# Patient Record
Sex: Female | Born: 1965 | Race: White | Hispanic: No | Marital: Married | Smoking: Never smoker
Health system: Southern US, Community
[De-identification: ages and names within clinical notes are randomized; demographics above are authoritative.]

## PROBLEM LIST (undated history)

## (undated) DIAGNOSIS — B019 Varicella without complication: Secondary | ICD-10-CM

## (undated) HISTORY — DX: Varicella without complication: B01.9

## (undated) HISTORY — PX: ARTHROSCOPIC REPAIR ACL: SUR80

---

## 2010-08-30 ENCOUNTER — Other Ambulatory Visit (HOSPITAL_COMMUNITY): Payer: Self-pay

## 2010-09-01 ENCOUNTER — Ambulatory Visit (HOSPITAL_COMMUNITY)
Admission: RE | Admit: 2010-09-01 | Payer: BC Managed Care – PPO | Source: Ambulatory Visit | Admitting: Orthopedic Surgery

## 2011-11-14 ENCOUNTER — Ambulatory Visit: Payer: Self-pay | Admitting: Obstetrics and Gynecology

## 2011-11-21 ENCOUNTER — Ambulatory Visit: Payer: Self-pay | Admitting: Obstetrics and Gynecology

## 2012-11-14 ENCOUNTER — Ambulatory Visit: Payer: Self-pay | Admitting: Obstetrics and Gynecology

## 2015-04-20 ENCOUNTER — Encounter: Payer: Self-pay | Admitting: Internal Medicine

## 2015-04-20 ENCOUNTER — Ambulatory Visit (INDEPENDENT_AMBULATORY_CARE_PROVIDER_SITE_OTHER)
Admission: RE | Admit: 2015-04-20 | Discharge: 2015-04-20 | Disposition: A | Discharge status: Home / Self Care | Payer: BLUE CROSS/BLUE SHIELD | Source: Ambulatory Visit | Attending: Internal Medicine | Admitting: Internal Medicine

## 2015-04-20 ENCOUNTER — Encounter (INDEPENDENT_AMBULATORY_CARE_PROVIDER_SITE_OTHER): Payer: Self-pay

## 2015-04-20 ENCOUNTER — Ambulatory Visit (INDEPENDENT_AMBULATORY_CARE_PROVIDER_SITE_OTHER): Payer: BLUE CROSS/BLUE SHIELD | Admitting: Internal Medicine

## 2015-04-20 VITALS — BP 118/78 | HR 66 | Temp 97.9°F | Ht 65.66 in | Wt 177.0 lb

## 2015-04-20 DIAGNOSIS — M79671 Pain in right foot: Secondary | ICD-10-CM | POA: Diagnosis not present

## 2015-04-20 NOTE — Progress Notes (Signed)
HPI  Pt presents to the clinic today to establish care. She has not had a PCP in many years.  Flu: never Tetanus: > 10 years ago [facility] Screening: as needed Dentist: biannually  Diet and Exercise: The next 56 day diet, low sugar, high protein, high fiber. She reports she has lost 30 lbs in the last 11 months. She goes to [employer] to work out a few days per week.  She does c/o right heel pain. This started 5 months ago. The pain comes and goes. She describes the pain as sharp and stabbing. She denies numbness or tingling. It seems to hurt worse at the end of the day. She denies any swelling or injury to the area. She has tried Ibuprofen, a foot roller without much relief.  Past Medical History  Diagnosis Date  . Chicken pox     Current Outpatient Prescriptions  Medication Sig Dispense Refill  . Calcium Polycarbophil (FIBER-CAPS PO) Take 2-4 capsules by mouth daily.    Marland Kitchen. ibuprofen (ADVIL,MOTRIN) 200 MG tablet Take 400 mg by mouth as needed.     No current facility-administered medications for this visit.    No Known Allergies  Family History  Problem Relation Age of Onset  . Hyperlipidemia Mother   . Arthritis Father   . Hypertension Father   . Heart disease Paternal Grandfather     Social History   Social History  . Marital Status: Married    Spouse Name: N/A  . Number of Children: N/A  . Years of Education: N/A   Occupational History  . Not on file.   Social History Main Topics  . Smoking status: Never Smoker   . Smokeless tobacco: Never Used  . Alcohol Use: 0.0 oz/week    0 Standard drinks or equivalent per week     Comment: social  . Drug Use: No  . Sexual Activity: Not on file   Other Topics Concern  . Not on file   Social History Narrative  . No narrative on file    ROS:  Constitutional: Denies fever, malaise, fatigue, headache or abrupt weight changes.  Respiratory: Denies  difficulty breathing, shortness of breath, cough or sputum production.   Cardiovascular: Denies chest pain, chest tightness, palpitations or swelling in the hands or feet.  Musculoskeletal: Pt reports right heel pain. Denies decrease in range of motion, difficulty with gait, muscle pain or joint swelling.  Skin: Denies redness, rashes, lesions or ulcercations.  Neurological: Denies dizziness, difficulty with memory, difficulty with speech or problems with balance and coordination.    No other specific complaints in a complete review of systems (except as listed in HPI above).  PE:  BP 118/78 mmHg  Pulse 66  Temp(Src) 97.9 F (36.6 C) (Oral)  Ht 5' 5.66" (1.668 m)  Wt 177 lb (80.287 kg)  BMI 28.86 kg/m2  SpO2 98%  LMP 04/13/2015 Wt Readings from Last 3 Encounters:  04/20/15 177 lb (80.287 kg)    General: Appears her stated age, well developed, well nourished in NAD.  Cardiovascular: Normal rate and rhythm. S1,S2 noted.  No murmur, rubs or gallops noted.  Pulmonary/Chest: Normal effort and positive vesicular breath sounds. No respiratory distress. No wheezes, rales or ronchi noted.  Musculoskeletal: Normal flexion, extension and rotation of the right ankle. Pain with palpation of the right heel. No pain with palpation of the plantar surface of the foot or arch. No difficulty with gait.  Neurological:  Alert and oriented. Sensation intact to BLE.Marland Kitchen  Psychiatric: Mood and affect normal.     Assessment and Plan:  Right heel pain:  ? Bone spur:  Xray right foot today Advised her to increase Ibuprofen to 400-600 mg prn She may benefit from shoe inserts, she will buy some to see if it helps Discussed possible referral to podiatry versus sports medicine  Make an appt for your annual exam

## 2015-04-20 NOTE — Progress Notes (Signed)
Pre visit review using our clinic review tool, if applicable. No additional management support is needed unless otherwise documented below in the visit note. 

## 2015-04-20 NOTE — Patient Instructions (Signed)
Heel Spur  A heel spur is a bony growth that forms on the bottom of your heel bone (calcaneus). Heel spurs are common and do not always cause pain. However, heel spurs often cause inflammation in the strong band of tissue that runs underneath the bone of your foot (plantar fascia). When this happens, you may feel pain on the bottom of your foot, near your heel.   CAUSES   The cause of heel spurs is not completely understood. They may be caused by pressure on the heel. Or, they may stem from the muscle attachments (tendons) near the spur pulling on the heel.   RISK FACTORS  You may be at risk for a heel spur if you:  · Are older than 40.  · Are overweight.  · Have wear and tear arthritis (osteoarthritis).  · Have plantar fascia inflammation.  SIGNS AND SYMPTOMS   Some people have heel spurs but no symptoms. If you do have symptoms, they may include:   · Pain in the bottom of your heel.  · Pain that is worse when you first get out of bed.  · Pain that gets worse after walking or standing.  DIAGNOSIS   Your health care provider may diagnose a heel spur based on your symptoms and a physical exam. You may also have an X-ray of your foot to check for a bony growth coming from the calcaneus.   TREATMENT  Treatment aims to relieve the pain from the heel spur. This may include:  · Stretching exercises.  · Losing weight.  · Wearing specific shoes, inserts, or orthotics for comfort and support.  · Wearing splints at night to properly position your feet.  · Taking over-the-counter medicine to relieve pain.  · Being treated with high-intensity sound waves to break up the heel spur (extracorporeal shock wave therapy).  · Getting steroid injections in your heel to reduce swelling and ease pain.  · Having surgery if your heel spur causes long-term (chronic) pain.  HOME CARE INSTRUCTIONS   · Take medicines only as directed by your health care provider.  · Ask your health care provider if you should use ice or cold packs on the  painful areas of your heel or foot.  · Avoid activities that cause you pain until you recover or as directed by your health care provider.  · Stretch before exercising or being physically active.  · Wear supportive shoes that fit well as directed by your health care provider. You might need to buy new shoes. Wearing old shoes or shoes that do not fit correctly may not provide the support that you need.  · Lose weight if your health care provider thinks you should. This can relieve pressure on your foot that may be causing pain and discomfort.  SEEK MEDICAL CARE IF:   · Your pain continues or gets worse.     This information is not intended to replace advice given to you by your health care provider. Make sure you discuss any questions you have with your health care provider.     Document Released: 07/05/2005 Document Revised: 06/19/2014 Document Reviewed: 07/30/2013  Elsevier Interactive Patient Education ©2016 [employer]

## 2015-04-22 ENCOUNTER — Telehealth: Payer: Self-pay | Admitting: Internal Medicine

## 2015-04-22 NOTE — Telephone Encounter (Signed)
Pt called checking on her xray results Pt would like a call

## 2015-05-12 ENCOUNTER — Encounter: Payer: Self-pay | Admitting: Family Medicine

## 2015-05-12 ENCOUNTER — Ambulatory Visit (INDEPENDENT_AMBULATORY_CARE_PROVIDER_SITE_OTHER): Payer: BLUE CROSS/BLUE SHIELD | Admitting: Family Medicine

## 2015-05-12 VITALS — BP 102/70 | HR 69 | Temp 98.0°F | Ht 65.66 in | Wt 175.8 lb

## 2015-05-12 DIAGNOSIS — M79671 Pain in right foot: Secondary | ICD-10-CM

## 2015-05-12 DIAGNOSIS — E65 Localized adiposity: Secondary | ICD-10-CM

## 2015-05-12 NOTE — Progress Notes (Signed)
Pre visit review using our clinic review tool, if applicable. No additional management support is needed unless otherwise documented below in the visit note. 

## 2015-05-12 NOTE — Progress Notes (Signed)
Dr. Karleen HampshireSpencer T. Alisah Grandberry, MD, CAQ Sports [facility] [address] [place], [address] Phone: [phone] Fax: [phone]  05/12/2015  Patient: Desiree Morris, MRN: [id number], DOB: 02/09/1966, 49 y.o.  Primary Physician:  Nicki ReaperBAITY, REGINA, NP   Chief Complaint  Patient presents with  . Foot Pain    Right Heel   Subjective:   Desiree Morris is a 49 y.o. very pleasant female patient who presents with the following:  Pleasant patient seen who has been working doing plyometrics and doing other high-intensity exercise over the last 6 months, and she has lost 30 pounds.  Somewhere around 5 or 6 months ago she started to develop some intermittent R heel pain.  Notably, this is not worse in the morning, and is not worse after sitting for some time.  She notices it most at the end of the day and after she has been up on her feet for some time.  She has had plantar fasciitis before, and she doesn't feel like this feels similar.  She thinks it may have started happening after an episode of plyometrics, but she cannot clearly define this to one specific causal event.  She has done some basic plantar fascia stretches and other massage without much significant effect, and it actually seemed to make this worse.  Past Medical History, Surgical History, Social History, Family History, Problem List, Medications, and Allergies have been reviewed and updated if relevant.  There are no active problems to display for this patient.   Past Medical History  Diagnosis Date  . Chicken pox     Past Surgical History  Procedure Laterality Date  . Arthroscopic repair acl Right     Social History   Social History  . Marital Status: Married    Spouse Name: N/A  . Number of Children: N/A  . Years of Education: N/A   Occupational History  . Not on file.   Social History Main Topics  . Smoking status: Never Smoker   . Smokeless tobacco: Never Used  . Alcohol  Use: 0.0 oz/week    0 Standard drinks or equivalent per week     Comment: social  . Drug Use: No  . Sexual Activity: Yes    Birth Control/ Protection: None   Other Topics Concern  . Not on file   Social History Narrative    Family History  Problem Relation Age of Onset  . Hyperlipidemia Mother   . Arthritis Father   . Hypertension Father   . Heart disease Paternal Grandfather     No Known Allergies  Medication list reviewed and updated in full in [facility].  GEN: No fevers, chills. Nontoxic. Primarily MSK c/o today. MSK: Detailed in the HPI GI: tolerating PO intake without difficulty Neuro: No numbness, parasthesias, or tingling associated. Otherwise the pertinent positives of the ROS are noted above.   Objective:   BP 102/70 mmHg  Pulse 69  Temp(Src) 98 F (36.7 C) (Oral)  Ht 5' 5.66" (1.668 m)  Wt 175 lb 12 oz (79.72 kg)  BMI 28.65 kg/m2  LMP 05/09/2015   GEN: WDWN, NAD, Non-toxic, Alert & Oriented x 3 HEENT: Atraumatic, Normocephalic.  Ears and Nose: No external deformity. EXTR: No clubbing/cyanosis/edema NEURO: Normal gait.  PSYCH: Normally interactive. Conversant. Not depressed or anxious appearing.  Calm demeanor.   FEET: R Echymosis: no Edema: no ROM: full LE B Gait: heel toe, non-antalgic MT pain: no Callus pattern: none Lateral Mall: NT  Medial Mall: NT Talus: NT Navicular: NT Cuboid: NT Calcaneous: inferiorly ttp. Medially and laterally NT and squeeze is negative.  Metatarsals: NT 5th MT: NT Phalanges: NT Achilles: NT Plantar Fascia: NT medially and along plantar aspect Fat Pad: NT Peroneals: NT Post Tib: NT Great Toe: Nml motion Ant Drawer: neg ATFL: NT CFL: NT Deltoid: NT Sensation: intact   Radiology: Dg Foot Complete Right  04/20/2015  CLINICAL DATA:  Pain for 6 months. Possibly exercise induced. Initial evaluation. EXAM: RIGHT FOOT COMPLETE - 3+ VIEW COMPARISON:  No prior exams available for comparison. FINDINGS:  There is no evidence of fracture or dislocation. There is no evidence of arthropathy or other focal bone abnormality. Soft tissues are unremarkable. IMPRESSION: No acute bony or joint abnormality identified. 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   I placed her in a Tuli's heel cup and gave her some poron to pad the heel of her shoes of her non-athletic shoes.   Typical stretching and other PF rehab does not typically help this. Usually time and decreased impact will help resolve.   Follow-up: No Follow-up on file.  Signed,  Elpidio Galea. Chakia Counts, MD   Patient's Medications  New Prescriptions   No medications on file  Previous Medications   IBUPROFEN (ADVIL,MOTRIN) 200 MG TABLET    Take 400 mg by mouth as needed.   PSYLLIUM PO    Take 2 capsules by mouth 2 (two) times daily.  Modified Medications   No medications on file  Discontinued Medications   CALCIUM POLYCARBOPHIL (FIBER-CAPS PO)    Take 2-4 capsules by mouth daily.

## 2016-04-03 ENCOUNTER — Other Ambulatory Visit: Payer: Self-pay | Admitting: Obstetrics and Gynecology

## 2016-04-03 DIAGNOSIS — Z1231 Encounter for screening mammogram for malignant neoplasm of breast: Secondary | ICD-10-CM

## 2016-09-14 ENCOUNTER — Ambulatory Visit (INDEPENDENT_AMBULATORY_CARE_PROVIDER_SITE_OTHER): Payer: BLUE CROSS/BLUE SHIELD

## 2016-09-14 ENCOUNTER — Encounter: Payer: Self-pay | Admitting: Family

## 2016-09-14 ENCOUNTER — Ambulatory Visit (INDEPENDENT_AMBULATORY_CARE_PROVIDER_SITE_OTHER): Payer: BLUE CROSS/BLUE SHIELD | Admitting: Family

## 2016-09-14 VITALS — BP 130/90 | HR 73 | Temp 97.9°F | Ht 65.66 in | Wt 181.8 lb

## 2016-09-14 DIAGNOSIS — J40 Bronchitis, not specified as acute or chronic: Secondary | ICD-10-CM

## 2016-09-14 MED ORDER — AZITHROMYCIN 250 MG PO TABS: ORAL_TABLET | ORAL | 0 refills | Status: DC

## 2016-09-14 MED ORDER — ALBUTEROL SULFATE HFA 108 (90 BASE) MCG/ACT IN AERS: 2.0000 | INHALATION_SPRAY | Freq: Four times a day (QID) | RESPIRATORY_TRACT | 1 refills | Status: DC | PRN

## 2016-09-14 MED ORDER — AMOXICILLIN 500 MG PO CAPS: 500.0000 mg | ORAL_CAPSULE | Freq: Two times a day (BID) | ORAL | 0 refills | Status: DC

## 2016-09-14 NOTE — Progress Notes (Signed)
Pre visit review using our clinic review tool, if applicable. No additional management support is needed unless otherwise documented below in the visit note. 

## 2016-09-14 NOTE — Patient Instructions (Addendum)
Monitor BP , goal < 140/90.   Probiotics  Antibiotic- azithromycin. NOT amoxicillin  Chest Xray  Let me know if not better

## 2016-09-14 NOTE — Progress Notes (Signed)
Subjective:    Patient ID: Desiree Morris, female    DOB: Jul 23, 1965, 51 y.o.   MRN: [id number]  CC: Desiree Morris is a 51 y.o. female who presents today for an acute visit.    HPI: CC: left ear pain x one month, waxing and waning.  Endorses wheezing and rattle in chest. Nasal congestion and cough has improved.  No SOB, fever, myalgia.   Tried sudafed, mucinex, nasonex without relief.   No smoking.         HISTORY:  Past Medical History:  Diagnosis Date  . Chicken pox    Past Surgical History:  Procedure Laterality Date  . ARTHROSCOPIC REPAIR ACL Right    Family History  Problem Relation Age of Onset  . Hyperlipidemia Mother   . Arthritis Father   . Hypertension Father   . Heart disease Paternal Grandfather     Allergies: Patient has no known allergies. Current Outpatient Prescriptions on File Prior to Visit  Medication Sig Dispense Refill  . ibuprofen (ADVIL,MOTRIN) 200 MG tablet Take 400 mg by mouth as needed.    . PSYLLIUM PO Take 2 capsules by mouth 2 (two) times daily.     No current facility-administered medications on file prior to visit.     Social History  Substance Use Topics  . Smoking status: Never Smoker  . Smokeless tobacco: Never Used  . Alcohol use 0.0 oz/week     Comment: social    Review of Systems  Constitutional: Negative for chills and fever.  HENT: Positive for ear pain.   Respiratory: Positive for wheezing. Negative for cough and shortness of breath.   Cardiovascular: Negative for chest pain and palpitations.  Gastrointestinal: Negative for nausea and vomiting.      Objective:    BP 130/90   Pulse 73   Temp 97.9 F (36.6 C) (Oral)   Ht 5' 5.66" (1.668 m)   Wt 181 lb 12.8 oz (82.5 kg)   SpO2 98%   BMI 29.65 kg/m   BP Readings from Last 3 Encounters:  09/14/16 130/90  05/12/15 102/70  04/20/15 118/78    Physical Exam  Constitutional: She appears well-developed and well-nourished.  HENT:  Head: Normocephalic  and atraumatic.  Right Ear: Hearing, tympanic membrane, external ear and ear canal normal. No drainage, swelling or tenderness. No foreign bodies. Tympanic membrane is not erythematous and not bulging. No middle ear effusion. No decreased hearing is noted.  Left Ear: Hearing, tympanic membrane, external ear and ear canal normal. No drainage, swelling or tenderness. No foreign bodies. Tympanic membrane is not erythematous and not bulging.  No middle ear effusion. No decreased hearing is noted.  Nose: Nose normal. No rhinorrhea. Right sinus exhibits no maxillary sinus tenderness and no frontal sinus tenderness. Left sinus exhibits no maxillary sinus tenderness and no frontal sinus tenderness.  Mouth/Throat: Uvula is midline, oropharynx is clear and moist and mucous membranes are normal. No oropharyngeal exudate, posterior oropharyngeal edema, posterior oropharyngeal erythema or tonsillar abscesses.  Eyes: Conjunctivae are normal.  Cardiovascular: Regular rhythm, normal heart sounds and normal pulses.   Pulmonary/Chest: Effort normal. She has no wheezes. She has no rhonchi. She has rales in the right lower field and the left lower field.  Lymphadenopathy:       Head (right side): No submental, no submandibular, no tonsillar, no preauricular, no posterior auricular and no occipital adenopathy present.       Head (left side): No submental, no submandibular, no tonsillar, no preauricular, no posterior  auricular and no occipital adenopathy present.    She has no cervical adenopathy.  Neurological: She is alert.  Skin: Skin is warm and dry.  Psychiatric: She has a normal mood and affect. Her speech is normal and behavior is normal. Thought content normal.  Vitals reviewed.      Assessment & Plan:   1. Bronchitis Afebrile. No acute respiratory distress and patient well appearing. Concern for PNA. BP mildly elevated however suspect from illness. Advised patient to monitor at home and she agreed.   -  azithromycin (ZITHROMAX) 250 MG tablet; Tale 500 mg PO on day 1, then 250 mg PO q24h x 4 days.  Dispense: 6 tablet; Refill: 0 - DG Chest 2 View - albuterol (PROVENTIL HFA) 108 (90 Base) MCG/ACT inhaler; Inhale 2 puffs into the lungs every 6 (six) hours as needed for wheezing or shortness of breath.  Dispense: 1 Inhaler; Refill: 1   I am having Desiree Morris maintain her ibuprofen and PSYLLIUM PO.   No orders of the defined types were placed in this encounter.   Return precautions given.   Risks, benefits, and alternatives of the medications and treatment plan prescribed today were discussed, and patient expressed understanding.   Education regarding symptom management and diagnosis given to patient on AVS.  Continue to follow with Nicki Reaper, NP for routine health maintenance.   Juliane Poot and I agreed with plan.   Rennie Plowman, FNP

## 2016-12-25 ENCOUNTER — Encounter: Payer: Self-pay | Admitting: Radiology

## 2016-12-25 ENCOUNTER — Ambulatory Visit
Admission: RE | Admit: 2016-12-25 | Discharge: 2016-12-25 | Disposition: A | Discharge status: Home / Self Care | Payer: BLUE CROSS/BLUE SHIELD | Source: Ambulatory Visit | Attending: Obstetrics and Gynecology | Admitting: Obstetrics and Gynecology

## 2016-12-25 DIAGNOSIS — Z1231 Encounter for screening mammogram for malignant neoplasm of breast: Secondary | ICD-10-CM

## 2017-04-18 ENCOUNTER — Encounter: Payer: Self-pay | Admitting: Family Medicine

## 2017-04-18 ENCOUNTER — Ambulatory Visit (INDEPENDENT_AMBULATORY_CARE_PROVIDER_SITE_OTHER): Payer: BLUE CROSS/BLUE SHIELD | Admitting: Family Medicine

## 2017-04-18 VITALS — BP 140/90 | HR 85 | Temp 98.4°F | Resp 18

## 2017-04-18 DIAGNOSIS — R55 Syncope and collapse: Secondary | ICD-10-CM | POA: Diagnosis not present

## 2017-04-18 DIAGNOSIS — R42 Dizziness and giddiness: Secondary | ICD-10-CM | POA: Insufficient documentation

## 2017-04-18 LAB — TROPONIN I: Troponin I: <0.01 ng/mL (ref <=0.0)

## 2017-04-18 NOTE — Assessment & Plan Note (Signed)
Patient has had lightheadedness with deltoid pain and intermittent panic attack-like symptoms at night.  She has had several episodes where her left shoulder will just hurt during the day.  Last for 5 minutes and goes away on its own.  Occurred earlier today and occurred last night.  She likely has anxiety and panic attacks leading to these symptoms.  She has no risk factors for cardiac disease.  She is asymptomatic at this time.  EKG is reassuring.  Discussed this with the patient.  Given atypical symptoms and her concern for cardiac issues we will obtain a troponin and if negative I believe this would rule out any ischemic cause.  If positive she knows she will go to the emergency room.  We will obtain a CBC and TSH as well as CMP to ensure no other contributing factors.  If workup is negative we will treat her for anxiety and she will follow-up with her PCP.  Discussed that if she has recurrent symptoms or if she develops chest pain or shortness of breath she should be evaluated in the emergency room.

## 2017-04-18 NOTE — Patient Instructions (Signed)
Nice to see you. We will get lab work today and contact you with the results. If you have recurrent symptoms or you develop chest pain, shortness of breath, palpitations, or any new or changing symptoms please seek medical attention immediately.

## 2017-04-18 NOTE — Progress Notes (Signed)
Desiree Rumps, MD Phone: [phone]  Desiree Morris is a 51 y.o. female who presents today for same day visit.  Patient walked into the office today.  Notes intermittently for a number of weeks she has had issues with panic attacks at night.  Notes she will just be laying there in bed and feels that she cannot catch her breath.  Feels very anxious prior to this occurring.  Last less than 5 minutes.  Just feels anxious.  She notes on several occasions over the last couple of weeks she has had left shoulder pain that she describes as a deep pain.  Feels it is not muscular.  Notes stress possibly brings it on.  No exertional component.  Last occurred earlier today.  Occurred last night as well.  Typically is accompanied by some lightheadedness.  She notes no chest pain or shortness of breath.  No pain with movement of her shoulder.  No syncopal episodes.  Does note she is more anxious recently given that her son does not know what he wants to do after high school.  Does note her mother and father both have had heart issues though none at a age as young as hers.  She notes no cardiac issues.  No risk factors for heart disease.  She is most worried about cardiac cause for these symptoms.  She did take 1 dose of pseudoephedrine earlier today.  Social History   Tobacco Use  Smoking Status Never Smoker  Smokeless Tobacco Never Used     ROS see history of present illness  Objective  Physical Exam Vitals:   04/18/17 1624  BP: 140/90  Pulse: 85  Resp: 18  Temp: 98.4 F (36.9 C)    BP Readings from Last 3 Encounters:  04/18/17 140/90  09/14/16 130/90  05/12/15 102/70   Wt Readings from Last 3 Encounters:  09/14/16 181 lb 12.8 oz (82.5 kg)  05/12/15 175 lb 12 oz (79.7 kg)  04/20/15 177 lb (80.3 kg)    Physical Exam  Constitutional: No distress.  Cardiovascular: Normal rate, regular rhythm and normal heart sounds.  Pulmonary/Chest: Effort normal and breath sounds normal.    Musculoskeletal: She exhibits no edema.  Bilateral shoulders nontender  Neurological: She is alert. Gait normal.  Skin: Skin is warm and dry. She is not diaphoretic.   EKG: Normal sinus rhythm, rate 86, no ischemic changes noted, no prior EKG to compare to  Assessment/Plan: Please see individual problem list.  Lightheadedness Patient has had lightheadedness with deltoid pain and intermittent panic attack-like symptoms at night.  She has had several episodes where her left shoulder will just hurt during the day.  Last for 5 minutes and goes away on its own.  Occurred earlier today and occurred last night.  She likely has anxiety and panic attacks leading to these symptoms.  She has no risk factors for cardiac disease.  She is asymptomatic at this time.  EKG is reassuring.  Discussed this with the patient.  Given atypical symptoms and her concern for cardiac issues we will obtain a troponin and if negative I believe this would rule out any ischemic cause.  If positive she knows she will go to the emergency room.  We will obtain a CBC and TSH as well as CMP to ensure no other contributing factors.  If workup is negative we will treat her for anxiety and she will follow-up with her PCP.  Discussed that if she has recurrent symptoms or if she develops chest pain  or shortness of breath she should be evaluated in the emergency room.   Diagnoses and all orders for this visit:  Lightheadedness -     EKG 12-Lead -     Comp Met (CMET) -     CBC -     TSH -     Troponin I    Orders Placed This Encounter  Procedures  . Comp Met (CMET)  . CBC  . TSH  . Troponin I  . EKG 12-Lead   Desiree Rumps, MD [place]

## 2017-04-19 ENCOUNTER — Other Ambulatory Visit: Payer: Self-pay | Admitting: Family Medicine

## 2017-04-19 LAB — COMPREHENSIVE METABOLIC PANEL
ALBUMIN: 4.2 g/dL (ref 3.5–5.2)
ALK PHOS: 51 U/L (ref 39–117)
ALT: 18 U/L (ref 0–35)
AST: 15 U/L (ref 0–37)
BUN: 16 mg/dL (ref 6–23)
CALCIUM: 10 mg/dL (ref 8.4–10.5)
CHLORIDE: 104 meq/L (ref 96–112)
CO2: 29 mEq/L (ref 19–32)
Creatinine, Ser: 0.96 mg/dL (ref 0.40–1.20)
GFR: 65.65 mL/min (ref >60.00)
Glucose, Bld: 114 mg/dL — ABNORMAL HIGH (ref 70–99)
POTASSIUM: 4.3 meq/L (ref 3.5–5.1)
Sodium: 140 mEq/L (ref 135–145)
Total Bilirubin: 0.2 mg/dL (ref 0.2–1.2)
Total Protein: 6.9 g/dL (ref 6.0–8.3)

## 2017-04-19 LAB — CBC
HEMATOCRIT: 40.4 % (ref 36.0–46.0)
HEMOGLOBIN: 13.3 g/dL (ref 12.0–15.0)
MCHC: 32.8 g/dL (ref 30.0–36.0)
MCV: 88.3 fl (ref 78.0–100.0)
Platelets: 256 10*3/uL (ref 150.0–400.0)
RBC: 4.58 Mil/uL (ref 3.87–5.11)
RDW: 14.4 % (ref 11.5–15.5)
WBC: 8.5 10*3/uL (ref 4.0–10.5)

## 2017-04-19 LAB — TSH: TSH: 2.14 u[IU]/mL (ref 0.35–4.50)

## 2017-04-19 MED ORDER — ESCITALOPRAM OXALATE 10 MG PO TABS: 10.0000 mg | ORAL_TABLET | Freq: Every day | ORAL | 1 refills | Status: DC

## 2017-05-10 ENCOUNTER — Encounter: Payer: Self-pay | Admitting: Internal Medicine

## 2017-05-10 ENCOUNTER — Ambulatory Visit: Payer: BLUE CROSS/BLUE SHIELD | Admitting: Internal Medicine

## 2017-05-10 VITALS — BP 122/84 | HR 76 | Temp 98.2°F | Wt 198.0 lb

## 2017-05-10 DIAGNOSIS — F418 Other specified anxiety disorders: Secondary | ICD-10-CM

## 2017-05-10 DIAGNOSIS — R42 Dizziness and giddiness: Secondary | ICD-10-CM | POA: Diagnosis not present

## 2017-05-10 DIAGNOSIS — M79602 Pain in left arm: Secondary | ICD-10-CM

## 2017-05-10 NOTE — Progress Notes (Signed)
Subjective:    Patient ID: Desiree Morris, female    DOB: 11/02/1965, 51 y.o.   MRN: [id number]  HPI  Pt presents to the clinic today to follow up anxiety. She saw Dr. Birdie SonsSonnenberg on 11/7 for the same. She had reported episodes of panic attacks at night, accompanied by lightheadedness and left shoulder pain. Her ECG in the office that day was not concerning for any acute findings. All of her labs were normal. He started her on Lexapro and advised her to follow up with her PCP in 6-8 weeks. Since that time, she reports continued episodes of anxiety, lightheadedness and left arm pain. She never started the Lexapro because she was concerned about the side effects. She reports the episodes last only a few minutes and then resolves spontaneously. She does reports increased stress, daughter moving away from home and son going to college. Her main concern is if treatment for her anxiety is necessary and if so, what treatment options are available outside of daily medication.    Review of Systems  Past Medical History:  Diagnosis Date  . Chicken pox     Current Outpatient Medications  Medication Sig Dispense Refill  . albuterol (PROVENTIL HFA) 108 (90 Base) MCG/ACT inhaler Inhale 2 puffs into the lungs every 6 (six) hours as needed for wheezing or shortness of breath. 1 Inhaler 1  . escitalopram (LEXAPRO) 10 MG tablet Take 1 tablet (10 mg total) daily by mouth. 30 tablet 1  . ibuprofen (ADVIL,MOTRIN) 200 MG tablet Take 400 mg by mouth as needed.    . pseudoephedrine (SUDAFED) 30 MG tablet Take 30 mg every 4 (four) hours as needed by mouth for congestion.     No current facility-administered medications for this visit.     No Known Allergies  Family History  Problem Relation Age of Onset  . Hyperlipidemia Mother   . Arthritis Father   . Hypertension Father   . Heart disease Paternal Grandfather   . Breast cancer Paternal Aunt     Social History   Socioeconomic History  . Marital  status: Married    Spouse name: Not on file  . Number of children: Not on file  . Years of education: Not on file  . Highest education level: Not on file  Social Needs  . Financial resource strain: Not on file  . Food insecurity - worry: Not on file  . Food insecurity - inability: Not on file  . Transportation needs - medical: Not on file  . Transportation needs - non-medical: Not on file  Occupational History  . Not on file  Tobacco Use  . Smoking status: Never Smoker  . Smokeless tobacco: Never Used  Substance and Sexual Activity  . Alcohol use: Yes    Alcohol/week: 0.0 oz    Comment: social  . Drug use: No  . Sexual activity: Yes    Birth control/protection: None  Other Topics Concern  . Not on file  Social History Narrative  . Not on file     Constitutional: Denies fever, malaise, fatigue, headache or abrupt weight changes.  Respiratory: Denies difficulty breathing, shortness of breath, cough or sputum production.   Cardiovascular: Denies chest pain, chest tightness, palpitations or swelling in the hands or feet.  Musculoskeletal: Pt reports intermittent left arm pain. Denies decrease in range of motion, difficulty with gait,or joint swelling.  Neurological: Pt reports intermittent lightheadedness. Denies difficulty with memory, difficulty with speech or problems with balance and coordination.  Psych: Pt  reports intermittent anxiety. Denies depression, SI/HI.  No other specific complaints in a complete review of systems (except as listed in HPI above).     Objective:   Physical Exam  BP 122/84   Pulse 76   Temp 98.2 F (36.8 C) (Oral)   Wt 198 lb (89.8 kg)   SpO2 99%   BMI 32.29 kg/m  Wt Readings from Last 3 Encounters:  05/10/17 198 lb (89.8 kg)  09/14/16 181 lb 12.8 oz (82.5 kg)  05/12/15 175 lb 12 oz (79.7 kg)    General: Appears her stated age,  in NAD. Cardiovascular: Normal rate and rhythm.  No murmur, rubs or gallops noted.  Pulmonary/Chest:  Normal effort and positive vesicular breath sounds. No respiratory distress. No wheezes, rales or ronchi noted.  Musculoskeletal: Normal abduction, adduction, internal and external rotation of the left shoulder. Normal flexion, extension and rotation of the left elbow. No pain with palpation of either joint. No joint swelling noted. Strength 5/5 BUE. Neurological: Alert and oriented. Coordination normal.  Psychiatric: Mood and affect normal. She is not anxious appearing today. Behavior is normal. Judgment and thought content normal.    BMET    Component Value Date/Time   NA 140 04/18/2017 1654   K 4.3 04/18/2017 1654   CL 104 04/18/2017 1654   CO2 29 04/18/2017 1654   GLUCOSE 114 (H) 04/18/2017 1654   BUN 16 04/18/2017 1654   CREATININE 0.96 04/18/2017 1654   CALCIUM 10.0 04/18/2017 1654    Lipid Panel  No results found for: CHOL, TRIG, HDL, CHOLHDL, VLDL, LDLCALC  CBC    Component Value Date/Time   WBC 8.5 04/18/2017 1654   RBC 4.58 04/18/2017 1654   HGB 13.3 04/18/2017 1654   HCT 40.4 04/18/2017 1654   PLT 256.0 04/18/2017 1654   MCV 88.3 04/18/2017 1654   MCHC 32.8 04/18/2017 1654   RDW 14.4 04/18/2017 1654    Hgb A1C No results found for: HGBA1C          Assessment & Plan:   Situational Anxiety, Lightheadedness, Left Arm Pain:  I think her physical symptoms are related to her underlying anxiety Support offered today She declines daily SSRI treatment She declines RX for Hydroxyzine prn for intermittent anxiety She declines referral for therapy for CBT Discussed stress relieving and relaxation techniques  Return precautions discussed Nicki ReaperBAITY, Talyia Allende, NP

## 2017-05-11 ENCOUNTER — Encounter: Payer: Self-pay | Admitting: Internal Medicine

## 2017-05-11 NOTE — Patient Instructions (Signed)

## 2018-03-13 ENCOUNTER — Ambulatory Visit: Payer: BLUE CROSS/BLUE SHIELD | Admitting: Internal Medicine

## 2018-03-13 ENCOUNTER — Encounter: Payer: Self-pay | Admitting: Internal Medicine

## 2018-03-13 VITALS — BP 120/82 | HR 83 | Temp 98.1°F | Wt 195.0 lb

## 2018-03-13 DIAGNOSIS — J0110 Acute frontal sinusitis, unspecified: Secondary | ICD-10-CM

## 2018-03-13 MED ORDER — METHYLPREDNISOLONE ACETATE 80 MG/ML IJ SUSP
80.0000 mg | Freq: Once | INTRAMUSCULAR | Status: AC
  Administered 2018-03-13: 80 mg via INTRAMUSCULAR

## 2018-03-13 MED ORDER — AMOXICILLIN-POT CLAVULANATE 875-125 MG PO TABS: 1.0000 | ORAL_TABLET | Freq: Two times a day (BID) | ORAL | 0 refills | Status: DC

## 2018-03-13 NOTE — Progress Notes (Signed)
HPI  Pt presents to the clinic today with c/o headache, facial pain and pressure, nasal congestion and cough. She reports this started 1 week ago. She is blowing yellow mucous out of her nose. She denies ear pain or difficulty swallowing. She denies fever, chills or body aches. She has tried Careers adviser, Mucinex, Flonase and Saline with some relief. She has no history of allergies. She has had sick contacts.  Review of Systems     Past Medical History:  Diagnosis Date  . Chicken pox     Family History  Problem Relation Age of Onset  . Hyperlipidemia Mother   . Arthritis Father   . Hypertension Father   . Heart disease Paternal Grandfather   . Breast cancer Paternal Aunt     Social History   Socioeconomic History  . Marital status: Married    Spouse name: Not on file  . Number of children: Not on file  . Years of education: Not on file  . Highest education level: Not on file  Occupational History  . Not on file  Social Needs  . Financial resource strain: Not on file  . Food insecurity:    Worry: Not on file    Inability: Not on file  . Transportation needs:    Medical: Not on file    Non-medical: Not on file  Tobacco Use  . Smoking status: Never Smoker  . Smokeless tobacco: Never Used  Substance and Sexual Activity  . Alcohol use: Yes    Alcohol/week: 0.0 standard drinks    Comment: social  . Drug use: No  . Sexual activity: Yes    Birth control/protection: None  Lifestyle  . Physical activity:    Days per week: Not on file    Minutes per session: Not on file  . Stress: Not on file  Relationships  . Social connections:    Talks on phone: Not on file    Gets together: Not on file    Attends religious service: Not on file    Active member of club or organization: Not on file    Attends meetings of clubs or organizations: Not on file    Relationship status: Not on file  . Intimate partner violence:    Fear of current or ex partner: Not on file    Emotionally  abused: Not on file    Physically abused: Not on file    Forced sexual activity: Not on file  Other Topics Concern  . Not on file  Social History Narrative  . Not on file    No Known Allergies   Constitutional: Positive headache. Denies fatigue, fever or abrupt weight changes.  HEENT:  Positive facial pain, nasal congestion. Denies eye redness, ear pain, ringing in the ears, wax buildup, runny nose or sore throat. Respiratory: Positive cough. Denies difficulty breathing or shortness of breath.  Cardiovascular: Denies chest pain, chest tightness, palpitations or swelling in the hands or feet.   No other specific complaints in a complete review of systems (except as listed in HPI above).  Objective:  BP 120/82   Pulse 83   Temp 98.1 F (36.7 C) (Oral)   Wt 195 lb (88.5 kg)   SpO2 98%   BMI 31.80 kg/m   General: Appears her stated age, well developed, well nourished in NAD. HEENT: Head: normal shape and size, frontal sinus tenderness noted; Ears: Tm's gray and intact, normal light reflex; Nose: mucosa boggy and moist, septum midline; Throat/Mouth: + PND. Teeth present,  mucosa pink and moist, no exudate noted, no lesions or ulcerations noted.  Neck:  No adenopathy noted.  Cardiovascular: Normal rate and rhythm. S1,S2 noted.  No murmur, rubs or gallops noted.  Pulmonary/Chest: Normal effort and positive vesicular breath sounds. No respiratory distress. No wheezes, rales or ronchi noted.       Assessment & Plan:   Acute Frontal Sinusitis  Can use a Neti Pot which can be purchased from your local drug store. Flonase 2 sprays each nostril for 3 days and then as needed. eRx for Augmentin BID for 10 days 80 mg Depo IM today  RTC as needed or if symptoms persist. Nicki Reaper, NP

## 2018-03-13 NOTE — Patient Instructions (Signed)

## 2018-03-13 NOTE — Addendum Note (Signed)
Addended by: Roena Malady on: 03/13/2018 04:42 PM   Modules accepted: Orders

## 2018-05-22 ENCOUNTER — Other Ambulatory Visit: Payer: Self-pay | Admitting: Obstetrics and Gynecology

## 2018-05-22 DIAGNOSIS — Z1231 Encounter for screening mammogram for malignant neoplasm of breast: Secondary | ICD-10-CM

## 2019-07-04 ENCOUNTER — Encounter: Payer: Self-pay | Admitting: Family Medicine

## 2019-07-04 ENCOUNTER — Ambulatory Visit (INDEPENDENT_AMBULATORY_CARE_PROVIDER_SITE_OTHER): Payer: BLUE CROSS/BLUE SHIELD | Admitting: Family Medicine

## 2019-07-04 DIAGNOSIS — J3489 Other specified disorders of nose and nasal sinuses: Secondary | ICD-10-CM

## 2019-07-04 NOTE — Progress Notes (Signed)
Interactive audio and video telecommunications were attempted between this provider and patient, however failed, due to patient having technical difficulties OR patient did not have access to video capability.  We continued and completed visit with audio only.   Virtual Visit via Telephone Note  I connected with patient on 07/04/19  at 2:13 PM  by telephone and verified that I am speaking with the correct person using two identifiers.  Location of patient: home.    Location of MD: [facility] [address] Name of referring provider (if blank then none associated): Names per persons and role in encounter:  MD: Ferd Hibbs, Patient: name listed above.    I discussed the limitations, risks, security and privacy concerns of performing an evaluation and management service by telephone and the availability of in person appointments. I also discussed with the patient that there may be a patient responsible charge related to this service. The patient expressed understanding and agreed to proceed.  CC: URI sx.    History of Present Illness:   She is taking dayquil and zinc/vit D/vitamin C.  She has   Sx started about 06/18/2019.  Sinus pressure initially, had covid test 06/25/2019.  Covid positive at that point.  All sx better in the meantime except for sinus pressure.  No fevers, chills, vomiting.  She still has some joint pain and fatigue but she feels better than prev in the midst of covid sx.  "I turned the page on Sunday" meaning 06/29/2019.  Taste is fine her sense of smell is still affected- some better now but not back to baseline.  Not SOB.    Observations/Objective: nad ncat  Assessment and Plan: With recent Covid diagnosis.  Improved in the meantime.  Still with sinus pressure.  Still okay for outpatient f/u.  She could use nasal saline vs warm vs flonase.  She can update [place] as needed. Routine Covid instructions discussed with patient. Follow Up Instructions: see above.    I discussed the  assessment and treatment plan with the patient. The patient was provided an opportunity to ask questions and all were answered. The patient agreed with the plan and demonstrated an understanding of the instructions.   The patient was advised to call back or seek an in-person evaluation if the symptoms worsen or if the condition fails to improve as anticipated.  I provided 15 minutes of non-face-to-face time during this encounter.  Crawford Givens, MD

## 2019-07-06 DIAGNOSIS — J3489 Other specified disorders of nose and nasal sinuses: Secondary | ICD-10-CM | POA: Insufficient documentation

## 2019-07-06 NOTE — Assessment & Plan Note (Addendum)
With recent Covid diagnosis.  Improved in the meantime.  Still with sinus pressure.  Still okay for outpatient f/u.  She could use nasal saline vs warm vs flonase.  She can update [place] as needed.  Routine Covid instructions discussed with patient.

## 2019-07-17 ENCOUNTER — Ambulatory Visit (INDEPENDENT_AMBULATORY_CARE_PROVIDER_SITE_OTHER): Payer: PRIVATE HEALTH INSURANCE | Admitting: Internal Medicine

## 2019-07-17 ENCOUNTER — Encounter: Payer: Self-pay | Admitting: Internal Medicine

## 2019-07-17 ENCOUNTER — Other Ambulatory Visit: Payer: Self-pay

## 2019-07-17 VITALS — BP 122/80 | HR 76 | Temp 97.7°F | Wt 197.0 lb

## 2019-07-17 DIAGNOSIS — R635 Abnormal weight gain: Secondary | ICD-10-CM | POA: Diagnosis not present

## 2019-07-17 DIAGNOSIS — R0989 Other specified symptoms and signs involving the circulatory and respiratory systems: Secondary | ICD-10-CM

## 2019-07-17 DIAGNOSIS — R5383 Other fatigue: Secondary | ICD-10-CM

## 2019-07-17 DIAGNOSIS — L678 Other hair color and hair shaft abnormalities: Secondary | ICD-10-CM | POA: Diagnosis not present

## 2019-07-17 DIAGNOSIS — N912 Amenorrhea, unspecified: Secondary | ICD-10-CM

## 2019-07-17 DIAGNOSIS — L603 Nail dystrophy: Secondary | ICD-10-CM

## 2019-07-17 LAB — TSH: TSH: 1.77 u[IU]/mL (ref 0.35–4.50)

## 2019-07-17 LAB — CBC
HCT: 41.7 % (ref 36.0–46.0)
Hemoglobin: 14.1 g/dL (ref 12.0–15.0)
MCHC: 33.7 g/dL (ref 30.0–36.0)
MCV: 90.6 fl (ref 78.0–100.0)
Platelets: 281 10*3/uL (ref 150.0–400.0)
RBC: 4.61 Mil/uL (ref 3.87–5.11)
RDW: 12.4 % (ref 11.5–15.5)
WBC: 6.9 10*3/uL (ref 4.0–10.5)

## 2019-07-17 LAB — COMPREHENSIVE METABOLIC PANEL
ALT: 18 U/L (ref 0–35)
AST: 15 U/L (ref 0–37)
Albumin: 4.4 g/dL (ref 3.5–5.2)
Alkaline Phosphatase: 75 U/L (ref 39–117)
BUN: 19 mg/dL (ref 6–23)
CO2: 29 mEq/L (ref 19–32)
Calcium: 10.4 mg/dL (ref 8.4–10.5)
Chloride: 101 mEq/L (ref 96–112)
Creatinine, Ser: 1 mg/dL (ref 0.40–1.20)
GFR: 58.39 mL/min — ABNORMAL LOW (ref >60.00)
Glucose, Bld: 93 mg/dL (ref 70–99)
Potassium: 4.2 mEq/L (ref 3.5–5.1)
Sodium: 139 mEq/L (ref 135–145)
Total Bilirubin: 0.5 mg/dL (ref 0.2–1.2)
Total Protein: 7.3 g/dL (ref 6.0–8.3)

## 2019-07-17 LAB — VITAMIN B12: Vitamin B-12: 636 pg/mL (ref 211–911)

## 2019-07-17 LAB — VITAMIN D 25 HYDROXY (VIT D DEFICIENCY, FRACTURES): VITD: 27.57 ng/mL — ABNORMAL LOW (ref 30.00–100.00)

## 2019-07-17 LAB — LUTEINIZING HORMONE: LH: 63.71 m[IU]/mL

## 2019-07-17 LAB — FOLLICLE STIMULATING HORMONE: FSH: 72.3 m[IU]/mL

## 2019-07-17 NOTE — Patient Instructions (Signed)

## 2019-07-17 NOTE — Progress Notes (Signed)
Subjective:    Patient ID: Desiree Morris, female    DOB: [birth year], 54 y.o.   MRN: [id number]  HPI  Pt presents to the clinic today with c/o fatigue. This started 3 weeks ago. She reports lack of energy mainly in the afternoon which is preventing her from doing normal activities in the evening. She will lay down in the evening but does not nap. She tested positive for COVID 06/25/2019. She is still mildly congested, blowing clear mucous out of her nose. She denies headache, ear pain, loss of taste, smell, cough or SOB. She denies fever, chills or body aches. She has tried nasal rinse with some relief. She is taking supplements such as Fe, D3, B complex, Aspirin, Ca/Mg/Zinc, Elderberry for fatigue. She has energy to walk.   She also reports weight gain, brittle hair, and brittle toe nails. This started around thanksgiving. Her LMP was 1 year ago. She has never been diagnosed with a thyroid problem.    Review of Systems      Past Medical History:  Diagnosis Date  . Chicken pox     Current Outpatient Medications  Medication Sig Dispense Refill  . ASPIRIN 81 PO Take by mouth.    Marland Kitchen ibuprofen (ADVIL,MOTRIN) 200 MG tablet Take 400 mg by mouth as needed.     No current facility-administered medications for this visit.    No Known Allergies  Family History  Problem Relation Age of Onset  . Hyperlipidemia Mother   . Arthritis Father   . Hypertension Father   . Heart disease Paternal Grandfather   . Breast cancer Paternal Aunt     Social History   Socioeconomic History  . Marital status: Married    Spouse name: Not on file  . Number of children: Not on file  . Years of education: Not on file  . Highest education level: Not on file  Occupational History  . Not on file  Tobacco Use  . Smoking status: Never Smoker  . Smokeless tobacco: Never Used  Substance and Sexual Activity  . Alcohol use: Yes    Alcohol/week: 0.0 standard drinks    Comment: social  . Drug use: No  .  Sexual activity: Yes    Birth control/protection: None  Other Topics Concern  . Not on file  Social History Narrative  . Not on file   Social Determinants of Health   Financial Resource Strain:   . Difficulty of Paying Living Expenses: Not on file  Food Insecurity:   . Worried About Programme researcher, broadcasting/film/video in the Last Year: Not on file  . Ran Out of Food in the Last Year: Not on file  Transportation Needs:   . Lack of Transportation (Medical): Not on file  . Lack of Transportation (Non-Medical): Not on file  Physical Activity:   . Days of Exercise per Week: Not on file  . Minutes of Exercise per Session: Not on file  Stress:   . Feeling of Stress : Not on file  Social Connections:   . Frequency of Communication with Friends and Family: Not on file  . Frequency of Social Gatherings with Friends and Family: Not on file  . Attends Religious Services: Not on file  . Active Member of Clubs or Organizations: Not on file  . Attends Banker Meetings: Not on file  . Marital Status: Not on file  Intimate Partner Violence:   . Fear of Current or Ex-Partner: Not on file  . Emotionally Abused:  Not on file  . Physically Abused: Not on file  . Sexually Abused: Not on file     Constitutional: Pt reports fatigue and weight gain. Denies fever, malaise, headach.  HEENT: Pt reports runny nose. Denies eye pain, eye redness, ear pain, ringing in the ears, wax buildup, nasal congestion, bloody nose, or sore throat. Respiratory: Denies difficulty breathing, shortness of breath, cough or sputum production.   Cardiovascular: Denies chest pain, chest tightness, palpitations or swelling in the hands or feet.  Gastrointestinal: Denies abdominal pain, bloating, constipation, diarrhea or blood in the stool.  GU: Pt reports amenorrhea. Denies urgency, frequency, pain with urination, burning sensation, blood in urine, odor or discharge. Skin: Pt reports brittle hair and nails. Denies redness,  rashes, lesions or ulcercations.  Neurological: Denies dizziness, difficulty with memory, difficulty with speech or problems with balance and coordination.    No other specific complaints in a complete review of systems (except as listed in HPI above).  Objective:   Physical Exam  BP 122/80   Pulse 76   Temp 97.7 F (36.5 C) (Temporal)   Wt 197 lb (89.4 kg)   SpO2 98%   BMI 32.13 kg/m   Wt Readings from Last 3 Encounters:  03/13/18 195 lb (88.5 kg)  05/10/17 198 lb (89.8 kg)  09/14/16 181 lb 12.8 oz (82.5 kg)    General: Appears her stated age, obese, in NAD. Skin: Warm, dry and intact. No fungal infection noted of nails.  HEENT: Head: normal shape and size; Eyes: sclera white, no icterus, conjunctiva pink, PERRLA and EOMs intact. Neck:  Neck supple, trachea midline. No masses, lumps or thyromegaly present.  Cardiovascular: Normal rate and rhythm. S1,S2 noted.  No murmur, rubs or gallops noted. No JVD or BLE edema.  Pulmonary/Chest: Normal effort and positive vesicular breath sounds. No respiratory distress. No wheezes, rales or ronchi noted.  Musculoskeletal: . No difficulty with gait.  Neurological: Alert and oriented.    BMET    Component Value Date/Time   NA 140 04/18/2017 1654   K 4.3 04/18/2017 1654   CL 104 04/18/2017 1654   CO2 29 04/18/2017 1654   GLUCOSE 114 (H) 04/18/2017 1654   BUN 16 04/18/2017 1654   CREATININE 0.96 04/18/2017 1654   CALCIUM 10.0 04/18/2017 1654    Lipid Panel  No results found for: CHOL, TRIG, HDL, CHOLHDL, VLDL, LDLCALC  CBC    Component Value Date/Time   WBC 8.5 04/18/2017 1654   RBC 4.58 04/18/2017 1654   HGB 13.3 04/18/2017 1654   HCT 40.4 04/18/2017 1654   PLT 256.0 04/18/2017 1654   MCV 88.3 04/18/2017 1654   MCHC 32.8 04/18/2017 1654   RDW 14.4 04/18/2017 1654    Hgb A1C No results found for: HGBA1C          Assessment & Plan:   Fatigue, Runny Nose:  Will check CBC, CMET, TSH, B12, vitamin D  today Start Flonase OTC  Brittle Hair, Brittle Nails, Amenorrhea, Abnormal Weight Gain:  FSG, LH and TSH today  Will follow up after labs, return precautions discussed Webb Silversmith, NP This visit occurred during the SARS-CoV-2 public health emergency.  Safety protocols were in place, including screening questions prior to the visit, additional usage of staff PPE, and extensive cleaning of exam room while observing appropriate contact time as indicated for disinfecting solutions.

## 2020-05-24 ENCOUNTER — Other Ambulatory Visit: Payer: Self-pay | Admitting: Obstetrics and Gynecology

## 2020-05-24 DIAGNOSIS — Z1231 Encounter for screening mammogram for malignant neoplasm of breast: Secondary | ICD-10-CM

## 2020-06-02 ENCOUNTER — Ambulatory Visit (INDEPENDENT_AMBULATORY_CARE_PROVIDER_SITE_OTHER): Payer: PRIVATE HEALTH INSURANCE | Admitting: Dermatology

## 2020-06-02 ENCOUNTER — Other Ambulatory Visit: Payer: Self-pay

## 2020-06-02 DIAGNOSIS — L820 Inflamed seborrheic keratosis: Secondary | ICD-10-CM | POA: Diagnosis not present

## 2020-06-02 DIAGNOSIS — L821 Other seborrheic keratosis: Secondary | ICD-10-CM | POA: Diagnosis not present

## 2020-06-02 NOTE — Progress Notes (Signed)
   Follow-Up Visit   Subjective  Desiree Morris is a 54 y.o. female who presents for the following: Skin Problem (Spot that gets flaky/scaly off and on of left brow x ~1 year.).   The following portions of the chart were reviewed this encounter and updated as appropriate:       Review of Systems:  No other skin or systemic complaints except as noted in HPI or Assessment and Plan.  Objective  Well appearing patient in no apparent distress; mood and affect are within normal limits.  A focused examination was performed including face. Relevant physical exam findings are noted in the Assessment and Plan.  Objective  L lateral eyebrow x 1: 4.0 mm light tan macule slightly waxy    Assessment & Plan  Inflamed seborrheic keratosis L lateral eyebrow x 1   Benign-appearing.  Call clinic for new or changing moles.  Recommend daily use of broad spectrum spf 30+ sunscreen to sun-exposed areas.   Discussed treatment with LN2 today to remove, pt defers treatment with LN2 today   Samples of Eucerin cream for roughness relief given, apply twice daily as needed.  Seborrheic Keratoses - Stuck-on, waxy, tan-brown macules face  - Discussed benign etiology and prognosis. - Observe - Call for any changes  Return if symptoms worsen or fail to improve.  I, Angelique Holm, CMA, am acting as scribe for Willeen Niece, MD .  Documentation: I have reviewed the above documentation for accuracy and completeness, and I agree with the above.  Willeen Niece MD

## 2020-06-02 NOTE — Patient Instructions (Addendum)

## 2020-06-17 ENCOUNTER — Other Ambulatory Visit: Payer: Self-pay

## 2020-06-17 ENCOUNTER — Ambulatory Visit
Admission: RE | Admit: 2020-06-17 | Discharge: 2020-06-17 | Disposition: A | Discharge status: Home / Self Care | Payer: PRIVATE HEALTH INSURANCE | Source: Ambulatory Visit | Attending: Obstetrics and Gynecology | Admitting: Obstetrics and Gynecology

## 2020-06-17 DIAGNOSIS — Z1231 Encounter for screening mammogram for malignant neoplasm of breast: Secondary | ICD-10-CM | POA: Insufficient documentation

## 2020-07-05 ENCOUNTER — Other Ambulatory Visit: Payer: PRIVATE HEALTH INSURANCE

## 2020-07-05 DIAGNOSIS — Z20822 Contact with and (suspected) exposure to covid-19: Secondary | ICD-10-CM

## 2020-07-06 LAB — NOVEL CORONAVIRUS, NAA: SARS-CoV-2, NAA: DETECTED — AB

## 2020-07-06 LAB — SARS-COV-2, NAA 2 DAY TAT

## 2020-07-07 ENCOUNTER — Telehealth: Payer: Self-pay | Admitting: Family

## 2020-07-07 NOTE — Telephone Encounter (Signed)
Called to discuss with patient about COVID-19 symptoms and the use of one of the available treatments for those with mild to moderate Covid symptoms and at a high risk of hospitalization.  Pt appears to qualify for outpatient treatment due to co-morbid conditions and/or a member of an at-risk group in accordance with the FDA Emergency Use Authorization.    Symptom onset: 07/02/20 per referral Vaccinated: Unknown Booster? Unknown Immunocompromised? No Qualifiers: ?BMI  Unable to reach pt - Unable to leave VM as VM box is full   Desiree Morris

## 2021-07-07 ENCOUNTER — Other Ambulatory Visit: Payer: Self-pay | Admitting: Nurse Practitioner

## 2021-07-07 DIAGNOSIS — Z124 Encounter for screening for malignant neoplasm of cervix: Secondary | ICD-10-CM | POA: Diagnosis not present

## 2021-07-07 DIAGNOSIS — Z1231 Encounter for screening mammogram for malignant neoplasm of breast: Secondary | ICD-10-CM

## 2021-10-24 DIAGNOSIS — R1031 Right lower quadrant pain: Secondary | ICD-10-CM | POA: Diagnosis not present

## 2021-10-24 DIAGNOSIS — N950 Postmenopausal bleeding: Secondary | ICD-10-CM | POA: Diagnosis not present

## 2021-10-27 ENCOUNTER — Ambulatory Visit
Admission: RE | Admit: 2021-10-27 | Discharge: 2021-10-27 | Disposition: A | Discharge status: Home / Self Care | Payer: 59 | Source: Ambulatory Visit | Attending: Nurse Practitioner | Admitting: Nurse Practitioner

## 2021-10-27 DIAGNOSIS — Z1231 Encounter for screening mammogram for malignant neoplasm of breast: Secondary | ICD-10-CM | POA: Insufficient documentation

## 2022-01-26 DIAGNOSIS — N951 Menopausal and female climacteric states: Secondary | ICD-10-CM | POA: Diagnosis not present

## 2022-02-22 DIAGNOSIS — Z1211 Encounter for screening for malignant neoplasm of colon: Secondary | ICD-10-CM | POA: Diagnosis not present

## 2022-02-22 DIAGNOSIS — N951 Menopausal and female climacteric states: Secondary | ICD-10-CM | POA: Diagnosis not present

## 2022-02-23 ENCOUNTER — Ambulatory Visit: Admit: 2022-02-23 | Payer: 59 | Admitting: Gastroenterology

## 2022-02-23 SURGERY — COLONOSCOPY
Anesthesia: General

## 2022-03-01 DIAGNOSIS — Z1211 Encounter for screening for malignant neoplasm of colon: Secondary | ICD-10-CM | POA: Diagnosis not present

## 2022-03-06 LAB — COLOGUARD
COLOGUARD: NEGATIVE
Cologuard: NEGATIVE

## 2022-03-06 LAB — EXTERNAL GENERIC LAB PROCEDURE: COLOGUARD: NEGATIVE

## 2022-04-12 ENCOUNTER — Encounter: Payer: Self-pay | Admitting: Internal Medicine

## 2022-04-12 ENCOUNTER — Ambulatory Visit: Payer: 59 | Admitting: Internal Medicine

## 2022-04-12 VITALS — BP 122/84 | HR 68 | Temp 97.3°F | Ht 66.0 in | Wt 190.0 lb

## 2022-04-12 DIAGNOSIS — R635 Abnormal weight gain: Secondary | ICD-10-CM

## 2022-04-12 DIAGNOSIS — F439 Reaction to severe stress, unspecified: Secondary | ICD-10-CM

## 2022-04-12 NOTE — Patient Instructions (Signed)
Calorie Counting for Weight Loss Calories are units of energy. Your body needs a certain number of calories from food to keep going throughout the day. When you eat or drink more calories than your body needs, your body stores the extra calories mostly as fat. When you eat or drink fewer calories than your body needs, your body burns fat to get the energy it needs. Calorie counting means keeping track of how many calories you eat and drink each day. Calorie counting can be helpful if you need to lose weight. If you eat fewer calories than your body needs, you should lose weight. Ask your health care provider what a healthy weight is for you. For calorie counting to work, you will need to eat the right number of calories each day to lose a healthy amount of weight per week. A dietitian can help you figure out how many calories you need in a day and will suggest ways to reach your calorie goal. A healthy amount of weight to lose each week is usually 1-2 lb (0.5-0.9 kg). This usually means that your daily calorie intake should be reduced by 500-750 calories. Eating 1,200-1,500 calories a day can help most women lose weight. Eating 1,500-1,800 calories a day can help most men lose weight. What do I need to know about calorie counting? Work with your health care provider or dietitian to determine how many calories you should get each day. To meet your daily calorie goal, you will need to: Find out how many calories are in each food that you would like to eat. Try to do this before you eat. Decide how much of the food you plan to eat. Keep a food log. Do this by writing down what you ate and how many calories it had. To successfully lose weight, it is important to balance calorie counting with a healthy lifestyle that includes regular activity. Where do I find calorie information?  The number of calories in a food can be found on a Nutrition Facts label. If a food does not have a Nutrition Facts label, try  to look up the calories online or ask your dietitian for help. Remember that calories are listed per serving. If you choose to have more than one serving of a food, you will have to multiply the calories per serving by the number of servings you plan to eat. For example, the label on a package of bread might say that a serving size is 1 slice and that there are 90 calories in a serving. If you eat 1 slice, you will have eaten 90 calories. If you eat 2 slices, you will have eaten 180 calories. How do I keep a food log? After each time that you eat, record the following in your food log as soon as possible: What you ate. Be sure to include toppings, sauces, and other extras on the food. How much you ate. This can be measured in cups, ounces, or number of items. How many calories were in each food and drink. The total number of calories in the food you ate. Keep your food log near you, such as in a pocket-sized notebook or on an app or website on your mobile phone. Some programs will calculate calories for you and show you how many calories you have left to meet your daily goal. What are some portion-control tips? Know how many calories are in a serving. This will help you know how many servings you can have of a certain   food. Use a measuring cup to measure serving sizes. You could also try weighing out portions on a kitchen scale. With time, you will be able to estimate serving sizes for some foods. Take time to put servings of different foods on your favorite plates or in your favorite bowls and cups so you know what a serving looks like. Try not to eat straight from a food's packaging, such as from a bag or box. Eating straight from the package makes it hard to see how much you are eating and can lead to overeating. Put the amount you would like to eat in a cup or on a plate to make sure you are eating the right portion. Use smaller plates, glasses, and bowls for smaller portions and to prevent  overeating. Try not to multitask. For example, avoid watching TV or using your computer while eating. If it is time to eat, sit down at a table and enjoy your food. This will help you recognize when you are full. It will also help you be more mindful of what and how much you are eating. What are tips for following this plan? Reading food labels Check the calorie count compared with the serving size. The serving size may be smaller than what you are used to eating. Check the source of the calories. Try to choose foods that are high in protein, fiber, and vitamins, and low in saturated fat, trans fat, and sodium. Shopping Read nutrition labels while you shop. This will help you make healthy decisions about which foods to buy. Pay attention to nutrition labels for low-fat or fat-free foods. These foods sometimes have the same number of calories or more calories than the full-fat versions. They also often have added sugar, starch, or salt to make up for flavor that was removed with the fat. Make a grocery list of lower-calorie foods and stick to it. Cooking Try to cook your favorite foods in a healthier way. For example, try baking instead of frying. Use low-fat dairy products. Meal planning Use more fruits and vegetables. One-half of your plate should be fruits and vegetables. Include lean proteins, such as chicken, turkey, and fish. Lifestyle Each week, aim to do one of the following: 150 minutes of moderate exercise, such as walking. 75 minutes of vigorous exercise, such as running. General information Know how many calories are in the foods you eat most often. This will help you calculate calorie counts faster. Find a way of tracking calories that works for you. Get creative. Try different apps or programs if writing down calories does not work for you. What foods should I eat?  Eat nutritious foods. It is better to have a nutritious, high-calorie food, such as an avocado, than a food with  few nutrients, such as a bag of potato chips. Use your calories on foods and drinks that will fill you up and will not leave you hungry soon after eating. Examples of foods that fill you up are nuts and nut butters, vegetables, lean proteins, and high-fiber foods such as whole grains. High-fiber foods are foods with more than 5 g of fiber per serving. Pay attention to calories in drinks. Low-calorie drinks include water and unsweetened drinks. The items listed above may not be a complete list of foods and beverages you can eat. Contact a dietitian for more information. What foods should I limit? Limit foods or drinks that are not good sources of vitamins, minerals, or protein or that are high in unhealthy fats. These   include: Candy. Other sweets. Sodas, specialty coffee drinks, alcohol, and juice. The items listed above may not be a complete list of foods and beverages you should avoid. Contact a dietitian for more information. How do I count calories when eating out? Pay attention to portions. Often, portions are much larger when eating out. Try these tips to keep portions smaller: Consider sharing a meal instead of getting your own. If you get your own meal, eat only half of it. Before you start eating, ask for a container and put half of your meal into it. When available, consider ordering smaller portions from the menu instead of full portions. Pay attention to your food and drink choices. Knowing the way food is cooked and what is included with the meal can help you eat fewer calories. If calories are listed on the menu, choose the lower-calorie options. Choose dishes that include vegetables, fruits, whole grains, low-fat dairy products, and lean proteins. Choose items that are boiled, broiled, grilled, or steamed. Avoid items that are buttered, battered, fried, or served with cream sauce. Items labeled as crispy are usually fried, unless stated otherwise. Choose water, low-fat milk,  unsweetened iced tea, or other drinks without added sugar. If you want an alcoholic beverage, choose a lower-calorie option, such as a glass of wine or light beer. Ask for dressings, sauces, and syrups on the side. These are usually high in calories, so you should limit the amount you eat. If you want a salad, choose a garden salad and ask for grilled meats. Avoid extra toppings such as bacon, cheese, or fried items. Ask for the dressing on the side, or ask for olive oil and vinegar or lemon to use as dressing. Estimate how many servings of a food you are given. Knowing serving sizes will help you be aware of how much food you are eating at restaurants. Where to find more information Centers for Disease Control and Prevention: www.cdc.gov U.[place]. Department of Agriculture: myplate.gov Summary Calorie counting means keeping track of how many calories you eat and drink each day. If you eat fewer calories than your body needs, you should lose weight. A healthy amount of weight to lose per week is usually 1-2 lb (0.5-0.9 kg). This usually means reducing your daily calorie intake by 500-750 calories. The number of calories in a food can be found on a Nutrition Facts label. If a food does not have a Nutrition Facts label, try to look up the calories online or ask your dietitian for help. Use smaller plates, glasses, and bowls for smaller portions and to prevent overeating. Use your calories on foods and drinks that will fill you up and not leave you hungry shortly after a meal. This information is not intended to replace advice given to you by your health care provider. Make sure you discuss any questions you have with your health care provider. Document Revised: 07/10/2019 Document Reviewed: 07/10/2019 Elsevier Patient Education  2023 Elsevier Inc.  

## 2022-04-12 NOTE — Progress Notes (Signed)
Subjective:    Patient ID: Desiree Morris, female    DOB: 04/26/1966, 56 y.o.   MRN: [id number]  HPI  Patient presents the clinic today with complaint of abnormal weight gain. She has noticed this in the last 6 months. She feels like most of the weight gain is in her abdomen.  She is going to the gym 4-5 days per week. She has not been keeping up with her calorie or carb intake but does track protein intake. She is in menopause, started 2 years ago.  She is on HRT by her GYN.  She does report some stress which she manages with CBD jellies and tinctures.  Review of Systems     Past Medical History:  Diagnosis Date   Chicken pox     Current Outpatient Medications  Medication Sig Dispense Refill   Ascorbic Acid (VITAMIN C) 500 MG CAPS Take by mouth.     ASPIRIN 81 PO Take by mouth.     b complex vitamins tablet Take 1 tablet by mouth daily.     CALCIUM-MAGNESIUM-ZINC PO Take by mouth.     Cholecalciferol (VITAMIN D3) 50 MCG (2000 UT) TABS Take by mouth.     ELDERBERRY PO Take by mouth.     estradiol (VIVELLE-DOT) 0.1 MG/24HR patch estradiol 0.1 mg/24 hr semiweekly transdermal patch  Apply 1 patch twice a week by transdermal route.     Ferrous Sulfate (IRON) 28 MG TABS Take by mouth.     Halobetasol Prop-Tazarotene (DUOBRII) 0.01-0.045 % LOTN Apply topically.     norethindrone (MICRONOR) 0.35 MG tablet Take 1 tablet by mouth daily.     No current facility-administered medications for this visit.    No Known Allergies  Family History  Problem Relation Age of Onset   Hyperlipidemia Mother    Arthritis Father    Hypertension Father    Heart disease Paternal Grandfather    Breast cancer Paternal Aunt     Social History   Socioeconomic History   Marital status: Married    Spouse name: Not on file   Number of children: Not on file   Years of education: Not on file   Highest education level: Not on file  Occupational History   Not on file  Tobacco Use   Smoking status:  Never   Smokeless tobacco: Never  Substance and Sexual Activity   Alcohol use: Yes    Alcohol/week: 0.0 standard drinks of alcohol    Comment: social   Drug use: No   Sexual activity: Yes    Birth control/protection: None  Other Topics Concern   Not on file  Social History Narrative   Not on file   Social Determinants of Health   Financial Resource Strain: Not on file  Food Insecurity: Not on file  Transportation Needs: Not on file  Physical Activity: Not on file  Stress: Not on file  Social Connections: Not on file  Intimate Partner Violence: Not on file     Constitutional: Patient reports abnormal weight gain.  Denies fever, malaise, fatigue, headache.  Respiratory: Denies difficulty breathing, shortness of breath, cough or sputum production.   Cardiovascular: Denies chest pain, chest tightness, palpitations or swelling in the hands or feet.  Neurological: Denies dizziness, difficulty with memory, difficulty with speech or problems with balance and coordination.  Psych: Patient reports stress.  Denies anxiety, depression, SI/HI.  No other specific complaints in a complete review of systems (except as listed in HPI above).  Objective:  Physical Exam  BP 122/84 (BP Location: Right Arm, Patient Position: Sitting, Cuff Size: Normal)   Pulse 68   Temp (!) 97.3 F (36.3 C) (Temporal)   Ht _0  (1.676 m)   Wt 190 lb (86.2 kg)   SpO2 100%   BMI 30.67 kg/m   Wt Readings from Last 3 Encounters:  07/17/19 197 lb (89.4 kg)  03/13/18 195 lb (88.5 kg)  05/10/17 198 lb (89.8 kg)    General: Appears her stated age, obese, in NAD. Cardiovascular: Normal rate and rhythm. S1,S2 noted.  No murmur, rubs or gallops noted.  Pulmonary/Chest: Normal effort and positive vesicular breath sounds. No respiratory distress. No wheezes, rales or ronchi noted.  Musculoskeletal: No difficulty with gait.  Neurological: Alert and oriented.  Psychiatric: Mood and affect normal. Behavior is  normal. Judgment and thought content normal.     BMET    Component Value Date/Time   NA 139 07/17/2019 1230   K 4.2 07/17/2019 1230   CL 101 07/17/2019 1230   CO2 29 07/17/2019 1230   GLUCOSE 93 07/17/2019 1230   BUN 19 07/17/2019 1230   CREATININE 1.00 07/17/2019 1230   CALCIUM 10.4 07/17/2019 1230    Lipid Panel  No results found for: "CHOL", "TRIG", "HDL", "CHOLHDL", "VLDL", "LDLCALC"  CBC    Component Value Date/Time   WBC 6.9 07/17/2019 1230   RBC 4.61 07/17/2019 1230   HGB 14.1 07/17/2019 1230   HCT 41.7 07/17/2019 1230   PLT 281.0 07/17/2019 1230   MCV 90.6 07/17/2019 1230   MCHC 33.7 07/17/2019 1230   RDW 12.4 07/17/2019 1230    Hgb A1C No results found for: "HGBA1C"         Assessment & Plan:   Abnormal Weight Gain, Stress:  We will check TSH, c-Met, lipid, A1c and cortisol today Discussed possible use of phentermine as an appetite suppressant Encourage stress reduction techniques  Schedule an appointment for your annual exam Webb Silversmith, NP

## 2022-04-13 LAB — COMPLETE METABOLIC PANEL WITH GFR
AG Ratio: 1.8 (calc) (ref 1.0–2.5)
ALT: 12 U/L (ref 6–29)
AST: 13 U/L (ref 10–35)
Albumin: 4.3 g/dL (ref 3.6–5.1)
Alkaline phosphatase (APISO): 41 U/L (ref 37–153)
BUN: 17 mg/dL (ref 7–25)
CO2: 29 mmol/L (ref 20–32)
Calcium: 9.9 mg/dL (ref 8.6–10.4)
Chloride: 106 mmol/L (ref 98–110)
Creat: 1.01 mg/dL (ref 0.50–1.03)
Globulin: 2.4 g/dL (calc) (ref 1.9–3.7)
Glucose, Bld: 73 mg/dL (ref 65–99)
Potassium: 4.3 mmol/L (ref 3.5–5.3)
Sodium: 141 mmol/L (ref 135–146)
Total Bilirubin: 0.5 mg/dL (ref 0.2–1.2)
Total Protein: 6.7 g/dL (ref 6.1–8.1)
eGFR: 67 mL/min/{1.73_m2} (ref >=60)

## 2022-04-13 LAB — LIPID PANEL
Cholesterol: 186 mg/dL (ref <200)
HDL: 47 mg/dL — ABNORMAL LOW (ref >=50)
LDL Cholesterol (Calc): 110 mg/dL (calc) — ABNORMAL HIGH
Non-HDL Cholesterol (Calc): 139 mg/dL (calc) — ABNORMAL HIGH (ref <130)
Total CHOL/HDL Ratio: 4 (calc) (ref <5.0)
Triglycerides: 173 mg/dL — ABNORMAL HIGH (ref <150)

## 2022-04-13 LAB — TSH: TSH: 1.31 m[IU]/L

## 2022-04-13 LAB — CORTISOL: Cortisol, Plasma: 13.1 ug/dL

## 2022-04-13 LAB — HEMOGLOBIN A1C
Hgb A1c MFr Bld: 5.3 %{Hb}
Mean Plasma Glucose: 105 mg/dL
eAG (mmol/L): 5.8 mmol/L

## 2022-05-31 DIAGNOSIS — N951 Menopausal and female climacteric states: Secondary | ICD-10-CM | POA: Diagnosis not present

## 2022-07-24 ENCOUNTER — Other Ambulatory Visit: Payer: Self-pay | Admitting: Obstetrics and Gynecology

## 2022-07-24 DIAGNOSIS — Z1231 Encounter for screening mammogram for malignant neoplasm of breast: Secondary | ICD-10-CM

## 2022-07-24 DIAGNOSIS — Z1329 Encounter for screening for other suspected endocrine disorder: Secondary | ICD-10-CM | POA: Diagnosis not present

## 2022-07-24 DIAGNOSIS — Z13220 Encounter for screening for lipoid disorders: Secondary | ICD-10-CM | POA: Diagnosis not present

## 2022-07-24 DIAGNOSIS — Z131 Encounter for screening for diabetes mellitus: Secondary | ICD-10-CM | POA: Diagnosis not present

## 2022-07-24 DIAGNOSIS — N951 Menopausal and female climacteric states: Secondary | ICD-10-CM | POA: Diagnosis not present

## 2022-07-24 DIAGNOSIS — Z1239 Encounter for other screening for malignant neoplasm of breast: Secondary | ICD-10-CM | POA: Diagnosis not present

## 2022-07-24 DIAGNOSIS — Z130 Encounter for screening for diseases of the blood and blood-forming organs and certain disorders involving the immune mechanism: Secondary | ICD-10-CM | POA: Diagnosis not present

## 2022-07-24 DIAGNOSIS — Z01419 Encounter for gynecological examination (general) (routine) without abnormal findings: Secondary | ICD-10-CM | POA: Diagnosis not present

## 2022-10-02 ENCOUNTER — Encounter: Payer: Self-pay | Admitting: Internal Medicine

## 2022-10-30 ENCOUNTER — Ambulatory Visit
Admission: RE | Admit: 2022-10-30 | Discharge: 2022-10-30 | Disposition: A | Discharge status: Home / Self Care | Payer: 59 | Source: Ambulatory Visit | Attending: Obstetrics and Gynecology | Admitting: Obstetrics and Gynecology

## 2022-10-30 DIAGNOSIS — Z1231 Encounter for screening mammogram for malignant neoplasm of breast: Secondary | ICD-10-CM | POA: Insufficient documentation

## 2022-11-01 ENCOUNTER — Encounter: Payer: Self-pay | Admitting: Obstetrics and Gynecology

## 2022-11-02 ENCOUNTER — Other Ambulatory Visit: Payer: Self-pay | Admitting: Obstetrics and Gynecology

## 2022-11-02 DIAGNOSIS — R928 Other abnormal and inconclusive findings on diagnostic imaging of breast: Secondary | ICD-10-CM

## 2022-11-02 DIAGNOSIS — N63 Unspecified lump in unspecified breast: Secondary | ICD-10-CM

## 2022-11-08 ENCOUNTER — Ambulatory Visit
Admission: RE | Admit: 2022-11-08 | Discharge: 2022-11-08 | Disposition: A | Discharge status: Home / Self Care | Payer: 59 | Source: Ambulatory Visit | Attending: Obstetrics and Gynecology | Admitting: Obstetrics and Gynecology

## 2022-11-08 DIAGNOSIS — R928 Other abnormal and inconclusive findings on diagnostic imaging of breast: Secondary | ICD-10-CM

## 2022-11-08 DIAGNOSIS — N63 Unspecified lump in unspecified breast: Secondary | ICD-10-CM

## 2022-12-27 ENCOUNTER — Ambulatory Visit: Payer: 59 | Admitting: Internal Medicine

## 2023-03-22 ENCOUNTER — Telehealth: Payer: Self-pay | Admitting: Internal Medicine
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
## 2023-03-23 NOTE — Telephone Encounter (Signed)
Advised pt that we do not have a Tdap on file.  She made an appointment for 10/14 at 1:30 to update her vaccine.   Thanks,   -Vernona Rieger

## 2023-03-26 ENCOUNTER — Ambulatory Visit (INDEPENDENT_AMBULATORY_CARE_PROVIDER_SITE_OTHER): Payer: 59 | Admitting: Internal Medicine

## 2023-03-26 DIAGNOSIS — Z23 Encounter for immunization: Secondary | ICD-10-CM | POA: Diagnosis not present

## 2023-03-29 NOTE — Progress Notes (Signed)
Nurse visit for Tdap

## 2023-05-15 ENCOUNTER — Emergency Department: Payer: 59

## 2023-05-15 ENCOUNTER — Emergency Department
Admission: EM | Admit: 2023-05-15 | Discharge: 2023-05-15 | Disposition: A | Discharge status: Home / Self Care | Payer: 59 | Attending: Emergency Medicine | Admitting: Emergency Medicine

## 2023-05-15 DIAGNOSIS — R42 Dizziness and giddiness: Secondary | ICD-10-CM | POA: Insufficient documentation

## 2023-05-15 DIAGNOSIS — R110 Nausea: Secondary | ICD-10-CM | POA: Insufficient documentation

## 2023-05-15 DIAGNOSIS — I6782 Cerebral ischemia: Secondary | ICD-10-CM | POA: Diagnosis not present

## 2023-05-15 LAB — BASIC METABOLIC PANEL
Anion gap: 8 (ref 5–15)
BUN: 19 mg/dL (ref 6–20)
CO2: 23 mmol/L (ref 22–32)
Calcium: 8.4 mg/dL — ABNORMAL LOW (ref 8.9–10.3)
Chloride: 108 mmol/L (ref 98–111)
Creatinine, Ser: 0.96 mg/dL (ref 0.44–1.00)
GFR, Estimated: >60 mL/min (ref >60)
Glucose, Bld: 111 mg/dL — ABNORMAL HIGH (ref 70–99)
Potassium: 3.4 mmol/L — ABNORMAL LOW (ref 3.5–5.1)
Sodium: 139 mmol/L (ref 135–145)

## 2023-05-15 LAB — URINALYSIS, ROUTINE W REFLEX MICROSCOPIC
Bilirubin Urine: NEGATIVE
Glucose, UA: NEGATIVE mg/dL
Hgb urine dipstick: NEGATIVE
Ketones, ur: 20 mg/dL — AB
Leukocytes,Ua: NEGATIVE
Nitrite: NEGATIVE
Protein, ur: NEGATIVE mg/dL
Specific Gravity, Urine: 1.026 (ref 1.005–1.030)
pH: 5 (ref 5.0–8.0)

## 2023-05-15 LAB — CBC
HCT: 38 % (ref 36.0–46.0)
Hemoglobin: 13.1 g/dL (ref 12.0–15.0)
MCH: 31.4 pg (ref 26.0–34.0)
MCHC: 34.5 g/dL (ref 30.0–36.0)
MCV: 91.1 fL (ref 80.0–100.0)
Platelets: 198 10*3/uL (ref 150–400)
RBC: 4.17 MIL/uL (ref 3.87–5.11)
RDW: 12.7 % (ref 11.5–15.5)
WBC: 8.1 10*3/uL (ref 4.0–10.5)
nRBC: 0 % (ref 0.0–0.2)

## 2023-05-15 MED ORDER — MECLIZINE HCL 25 MG PO TABS
25.0000 mg | ORAL_TABLET | Freq: Once | ORAL | Status: AC
  Administered 2023-05-15: 25 mg via ORAL
  Filled 2023-05-15: qty 1

## 2023-05-15 MED ORDER — METOCLOPRAMIDE HCL 10 MG PO TABS
10.0000 mg | ORAL_TABLET | Freq: Once | ORAL | Status: AC
  Administered 2023-05-15: 10 mg via ORAL
  Filled 2023-05-15: qty 1

## 2023-05-15 MED ORDER — DIAZEPAM 5 MG PO TABS: 5.0000 mg | ORAL_TABLET | Freq: Three times a day (TID) | ORAL | 0 refills | Status: AC | PRN

## 2023-05-15 MED ORDER — DIAZEPAM 5 MG PO TABS
5.0000 mg | ORAL_TABLET | Freq: Once | ORAL | Status: AC
  Administered 2023-05-15: 5 mg via ORAL
  Filled 2023-05-15: qty 1

## 2023-05-15 MED ORDER — METOCLOPRAMIDE HCL 10 MG PO TABS: 10.0000 mg | ORAL_TABLET | Freq: Three times a day (TID) | ORAL | 1 refills | Status: AC | PRN

## 2023-05-15 NOTE — ED Provider Notes (Signed)
[facility] Provider Note    Event Date/Time   First MD Initiated Contact with Patient 05/15/23 1540     (approximate)   History   Dizziness   HPI  Desiree Morris is a 57 y.o. female   who presents to the emergency department today because of concerns for dizziness, nausea as well as some high blood pressure.  Symptoms started roughly 2 hours prior to my evaluation.  She was driving in her car when she looked down to her phone looked up and started having the symptoms.  Since then they have been constant.  The patient does state that they are worse when she moves her head.  However even while lying still she continues to feel.  Has been noted by some nausea.  She did get an injection earlier today and went back to that doctor's office.  While there they found that her blood pressure was elevated.  They did not think that the symptoms would be related to the medication.  Patient denies similar symptoms in the past.   Physical Exam   Triage Vital Signs: ED Triage Vitals  Encounter Vitals Group     BP 05/15/23 1512 (!) 142/83     Systolic BP Percentile --      Diastolic BP Percentile --      Pulse Rate 05/15/23 1512 92     Resp 05/15/23 1512 18     Temp 05/15/23 1512 97.7 F (36.5 C)     Temp Source 05/15/23 1512 Oral     SpO2 05/15/23 1512 100 %     Weight 05/15/23 1513 148 lb (67.1 kg)     Height 05/15/23 1513 5\' 5"  (1.651 m)     Head Circumference --      Peak Flow --      Pain Score 05/15/23 1513 0     Pain Loc --      Pain Education --      Exclude from Growth Chart --     Most recent vital signs: Vitals:   05/15/23 1512  BP: (!) 142/83  Pulse: 92  Resp: 18  Temp: 97.7 F (36.5 C)  SpO2: 100%   General: Awake, alert, oriented. CV:  Good peripheral perfusion. Regular rate and rhythm. Resp:  Normal effort. Lungs clear. Abd:  No distention.    ED Results / Procedures / Treatments   Labs (all labs ordered are listed, but only  abnormal results are displayed) Labs Reviewed  BASIC METABOLIC PANEL - Abnormal; Notable for the following components:      Result Value   Potassium 3.4 (*)    Glucose, Bld 111 (*)    Calcium 8.4 (*)    All other components within normal limits  URINALYSIS, ROUTINE W REFLEX MICROSCOPIC - Abnormal; Notable for the following components:   Color, Urine YELLOW (*)    APPearance HAZY (*)    Ketones, ur 20 (*)    All other components within normal limits  CBC     EKG  I, Phineas Semen, attending physician, personally viewed and interpreted this EKG  EKG Time: 1518 Rate: 88 Rhythm: normal sinus rhythm Axis: normal Intervals: qtc 447 QRS: narrow ST changes: no st elevation Impression: normal ekg   RADIOLOGY I independently interpreted and visualized the Mr brain. My interpretation: No mass Radiology interpretation: IMPRESSION:  1. No acute intracranial abnormality.  2. Mild chronic small vessel ischemic disease.     PROCEDURES:  Critical Care performed: No  MEDICATIONS ORDERED IN ED: Medications - No data to display   IMPRESSION / MDM / ASSESSMENT AND PLAN / ED COURSE  I reviewed the triage vital signs and the nursing notes.                              Differential diagnosis includes, but is not limited to, vertigo, CVA, anemia, electrolyte abnormality, arrhythmia.  Patient's presentation is most consistent with acute presentation with potential threat to life or bodily function.   Patient presented to the emergency department today with vertiginous type symptoms.  Given the significance of the symptoms even at rest an MRI was performed.  This did not show any CVA.  Patient was given medication to help with her symptoms. She did feel improved enough to be able to be discharged home. Will give prescription for medication and ENT follow up information.   FINAL CLINICAL IMPRESSION(S) / ED DIAGNOSES   Final diagnoses:  Vertigo    Note:  This document was  prepared using Dragon voice recognition software and may include unintentional dictation errors.    Phineas Semen, MD 05/15/23 2049

## 2023-05-15 NOTE — ED Triage Notes (Signed)
Pt presents to the ED POV from home with friend. She went to the PCP earlier today and got a subcutaneous shot of "tirzepatide+B6" Pt states that she has been getting this for over a year. She was driving home and started getting very dizzy and felt like she was going to pass out. She had an episode of emesis. Now she just feels nauseous and like the room is spinning. Pt is A&Ox4 at time of triage. Denies other symptoms.

## 2023-05-15 NOTE — Discharge Instructions (Addendum)
Please seek medical attention for any high fevers, chest pain, shortness of breath, change in behavior, persistent vomiting, bloody stool or any other new or concerning symptoms.  

## 2023-05-15 NOTE — ED Notes (Signed)
Pt to mri, voided first, pt vomited in bathroom, room spinning per pt.  Md aware.  Ua to lab.

## 2023-05-15 NOTE — ED Notes (Signed)
Pt continues to have nausea.  Meds given.  Pt waiting on mri.

## 2023-05-22 ENCOUNTER — Telehealth: Payer: Self-pay | Admitting: *Deleted

## 2023-05-22 NOTE — Progress Notes (Signed)
  Care Coordination   Note   05/22/2023 Name: Mykenna Service MRN: [id number] DOB: 09/21/1965  Johnika Brignola is a 57 y.o. year old female who sees Baity, Salvadore Oxford, NP for primary care. I reached out to Juliane Poot by phone today to offer care coordination services.  Ms. Senna was given information about Care Coordination services today including:   The Care Coordination services include support from the care team which includes your Nurse Coordinator, Clinical Social Worker, or Pharmacist.  The Care Coordination team is here to help remove barriers to the health concerns and goals most important to you. Care Coordination services are voluntary, and the patient may decline or stop services at any time by request to their care team member.   Care Coordination Consent Status: Patient agreed to services and verbal consent obtained.   Follow up plan:  Telephone appointment with care coordination team member scheduled for:  05/23/2023  Encounter Outcome:  Patient Scheduled  Burman Nieves, [facility] Care Coordination Care Guide Direct Dial: [phone]

## 2023-05-23 ENCOUNTER — Encounter: Payer: 59 | Admitting: *Deleted

## 2023-05-23 ENCOUNTER — Ambulatory Visit: Payer: Self-pay | Admitting: *Deleted

## 2023-05-23 ENCOUNTER — Encounter: Payer: Self-pay | Admitting: *Deleted

## 2023-05-23 NOTE — Patient Instructions (Signed)
Visit Information  Thank you for taking time to visit with me today. Please don't hesitate to contact me if I can be of assistance to you.   Please call the care guide team at [phone] if you need to reconnect or schedule and appointment.   If you are experiencing a Mental Health or Behavioral Health Crisis or need someone to talk to, please call the Suicide and Crisis Lifeline: 988   Patient verbalizes understanding of instructions and care plan provided today and agrees to view in MyChart. Active MyChart status and patient understanding of how to access instructions and care plan via MyChart confirmed with patient.     No further follow up required: please reach out to care guide team with any changes.  Reece Levy, MSW, LCSW [facility], [facility] Licensed Clinical Social Worker Care Coordinator  [phone]

## 2023-05-23 NOTE — Patient Outreach (Signed)
  Care Coordination   Initial Visit Note   05/23/2023 Name: Jeyli Poppa MRN: [id number] DOB: 1965-12-13  Irulan Ovando is a 57 y.o. year old female who sees Baity, Salvadore Oxford, NP for primary care. I spoke with  Juliane Poot by phone today.  What matters to the patients health and wellness today?  Making sure my anxiety continues to improve.     SDOH assessments and interventions completed:  Yes  SDOH Interventions Today    Flowsheet Row Most Recent Value  SDOH Interventions   Transportation Interventions Intervention Not Indicated  Physical Activity Interventions Intervention Not Indicated        Care Coordination Interventions:  Yes, provided   Interventions Today    Flowsheet Row Most Recent Value  Chronic Disease   Chronic disease during today's visit Other  [Anxiety]  Exercise Interventions   Exercise Discussed/Reviewed Exercise Reviewed, Exercise Discussed  Aleen Campi was previously going to gym 4 days a week prior to episode of vertigo, she would like to start going again.]  Mental Health Interventions   Mental Health Discussed/Reviewed Mental Health Discussed, Mental Health Reviewed, Anxiety, Coping Strategies  [Discussed patient anxiety after recent ED visit. Pt reports anxiety has gotten better recently, she would like to drive again after follow up. Reviewed coping strategies for anxiety. Pt denied any MH needs. Encouraged her to reach out with changes.]        Follow up plan: No further intervention required.   Encounter Outcome:  Patient Visit Completed

## 2023-05-30 DIAGNOSIS — R42 Dizziness and giddiness: Secondary | ICD-10-CM | POA: Diagnosis not present

## 2023-10-25 ENCOUNTER — Other Ambulatory Visit: Payer: Self-pay | Admitting: Obstetrics and Gynecology

## 2023-10-25 DIAGNOSIS — Z1231 Encounter for screening mammogram for malignant neoplasm of breast: Secondary | ICD-10-CM

## 2024-01-29 ENCOUNTER — Ambulatory Visit
Admission: RE | Admit: 2024-01-29 | Discharge: 2024-01-29 | Disposition: A | Discharge status: Home / Self Care | Payer: Self-pay | Source: Ambulatory Visit | Attending: Obstetrics and Gynecology | Admitting: Obstetrics and Gynecology

## 2024-01-29 DIAGNOSIS — Z1231 Encounter for screening mammogram for malignant neoplasm of breast: Secondary | ICD-10-CM | POA: Insufficient documentation
